# Patient Record
Sex: Female | Born: 1945 | Race: Black or African American | Hispanic: No | Marital: Married | Smoking: Never smoker
Health system: Southern US, Community
[De-identification: ages and names within clinical notes are randomized; demographics above are authoritative.]

## PROBLEM LIST (undated history)

## (undated) ENCOUNTER — Emergency Department: Disposition: A | Discharge status: Home / Self Care | Payer: Medicare HMO

## (undated) DIAGNOSIS — I10 Essential (primary) hypertension: Secondary | ICD-10-CM

## (undated) HISTORY — PX: NO PAST SURGERIES: SHX2092

## (undated) HISTORY — DX: Essential (primary) hypertension: I10

---

## 1997-10-04 ENCOUNTER — Ambulatory Visit: Admission: RE | Admit: 1997-10-04 | Discharge: 1997-10-04 | Payer: Self-pay | Admitting: Obstetrics and Gynecology

## 1997-11-30 ENCOUNTER — Observation Stay (HOSPITAL_COMMUNITY): Admission: EM | Admit: 1997-11-30 | Discharge: 1997-12-01 | Payer: Self-pay

## 1998-09-04 ENCOUNTER — Encounter: Payer: Self-pay | Admitting: Obstetrics and Gynecology

## 1998-09-04 ENCOUNTER — Ambulatory Visit (HOSPITAL_COMMUNITY): Admission: RE | Admit: 1998-09-04 | Discharge: 1998-09-04 | Payer: Self-pay | Admitting: Obstetrics and Gynecology

## 1998-09-12 ENCOUNTER — Ambulatory Visit (HOSPITAL_COMMUNITY): Admission: RE | Admit: 1998-09-12 | Discharge: 1998-09-12 | Payer: Self-pay | Admitting: Obstetrics and Gynecology

## 1998-09-12 ENCOUNTER — Encounter: Payer: Self-pay | Admitting: Obstetrics and Gynecology

## 1999-03-22 ENCOUNTER — Other Ambulatory Visit: Admission: RE | Admit: 1999-03-22 | Discharge: 1999-03-22 | Payer: Self-pay | Admitting: Obstetrics and Gynecology

## 1999-09-17 ENCOUNTER — Encounter: Payer: Self-pay | Admitting: Obstetrics and Gynecology

## 1999-09-17 ENCOUNTER — Ambulatory Visit (HOSPITAL_COMMUNITY): Admission: RE | Admit: 1999-09-17 | Discharge: 1999-09-17 | Payer: Self-pay | Admitting: Obstetrics and Gynecology

## 2000-04-16 ENCOUNTER — Other Ambulatory Visit: Admission: RE | Admit: 2000-04-16 | Discharge: 2000-04-16 | Payer: Self-pay | Admitting: Obstetrics and Gynecology

## 2000-09-17 ENCOUNTER — Ambulatory Visit (HOSPITAL_COMMUNITY): Admission: RE | Admit: 2000-09-17 | Discharge: 2000-09-17 | Payer: Self-pay | Admitting: *Deleted

## 2001-04-21 ENCOUNTER — Other Ambulatory Visit: Admission: RE | Admit: 2001-04-21 | Discharge: 2001-04-21 | Payer: Self-pay | Admitting: *Deleted

## 2001-09-23 ENCOUNTER — Ambulatory Visit (HOSPITAL_COMMUNITY): Admission: RE | Admit: 2001-09-23 | Discharge: 2001-09-23 | Payer: Self-pay | Admitting: *Deleted

## 2002-04-28 ENCOUNTER — Other Ambulatory Visit: Admission: RE | Admit: 2002-04-28 | Discharge: 2002-04-28 | Payer: Self-pay | Admitting: Obstetrics & Gynecology

## 2002-10-07 ENCOUNTER — Encounter (INDEPENDENT_AMBULATORY_CARE_PROVIDER_SITE_OTHER): Payer: Self-pay | Admitting: *Deleted

## 2002-10-07 ENCOUNTER — Ambulatory Visit (HOSPITAL_COMMUNITY): Admission: RE | Admit: 2002-10-07 | Discharge: 2002-10-07 | Payer: Self-pay | Admitting: Gastroenterology

## 2002-10-13 ENCOUNTER — Encounter: Payer: Self-pay | Admitting: Obstetrics & Gynecology

## 2002-10-13 ENCOUNTER — Encounter: Admission: RE | Admit: 2002-10-13 | Discharge: 2002-10-13 | Payer: Self-pay | Admitting: Obstetrics & Gynecology

## 2003-05-12 ENCOUNTER — Other Ambulatory Visit: Admission: RE | Admit: 2003-05-12 | Discharge: 2003-05-12 | Payer: Self-pay | Admitting: Obstetrics & Gynecology

## 2003-11-01 ENCOUNTER — Ambulatory Visit (HOSPITAL_COMMUNITY): Admission: RE | Admit: 2003-11-01 | Discharge: 2003-11-01 | Payer: Self-pay | Admitting: Family Medicine

## 2004-11-01 ENCOUNTER — Ambulatory Visit (HOSPITAL_COMMUNITY): Admission: RE | Admit: 2004-11-01 | Discharge: 2004-11-01 | Payer: Self-pay | Admitting: Family Medicine

## 2005-11-05 ENCOUNTER — Ambulatory Visit (HOSPITAL_COMMUNITY): Admission: RE | Admit: 2005-11-05 | Discharge: 2005-11-05 | Payer: Self-pay | Admitting: Family Medicine

## 2006-11-18 ENCOUNTER — Ambulatory Visit (HOSPITAL_COMMUNITY): Admission: RE | Admit: 2006-11-18 | Discharge: 2006-11-18 | Payer: Self-pay | Admitting: Family Medicine

## 2006-11-23 ENCOUNTER — Encounter: Admission: RE | Admit: 2006-11-23 | Discharge: 2006-11-23 | Payer: Self-pay | Admitting: Family Medicine

## 2007-11-18 ENCOUNTER — Ambulatory Visit (HOSPITAL_COMMUNITY): Admission: RE | Admit: 2007-11-18 | Discharge: 2007-11-18 | Payer: Self-pay | Admitting: Family Medicine

## 2007-12-27 ENCOUNTER — Ambulatory Visit (HOSPITAL_COMMUNITY): Admission: RE | Admit: 2007-12-27 | Discharge: 2007-12-27 | Payer: Self-pay | Admitting: Gastroenterology

## 2007-12-27 ENCOUNTER — Encounter (INDEPENDENT_AMBULATORY_CARE_PROVIDER_SITE_OTHER): Payer: Self-pay | Admitting: Gastroenterology

## 2008-09-21 ENCOUNTER — Emergency Department (HOSPITAL_COMMUNITY): Admission: EM | Admit: 2008-09-21 | Discharge: 2008-09-21 | Payer: Self-pay | Admitting: Emergency Medicine

## 2008-11-22 ENCOUNTER — Ambulatory Visit (HOSPITAL_COMMUNITY): Admission: RE | Admit: 2008-11-22 | Discharge: 2008-11-22 | Payer: Self-pay | Admitting: Family Medicine

## 2009-12-05 ENCOUNTER — Ambulatory Visit (HOSPITAL_COMMUNITY): Admission: RE | Admit: 2009-12-05 | Discharge: 2009-12-05 | Payer: Self-pay | Admitting: Family Medicine

## 2009-12-11 ENCOUNTER — Encounter: Admission: RE | Admit: 2009-12-11 | Discharge: 2009-12-11 | Payer: Self-pay | Admitting: Family Medicine

## 2010-04-28 ENCOUNTER — Encounter: Payer: Self-pay | Admitting: Family Medicine

## 2010-08-20 NOTE — Op Note (Signed)
NAME:  Kathleen Berg, Kathleen Berg           ACCOUNT NO.:  [id number]   MEDICAL RECORD NO.:  [id number]          PATIENT TYPE:  AMB   LOCATION:  ENDO                         FACILITY:  [facility]   PHYSICIAN:  Anselmo Rod, M.D.  DATE OF BIRTH:  1946/03/14   DATE OF PROCEDURE:  12/27/2007  DATE OF DISCHARGE:  12/27/2007                               OPERATIVE REPORT   PROCEDURE PERFORMED:  Colonoscopy with multiple cold biopsies.   ENDOSCOPIST:  Anselmo Rod, MD.   INSTRUMENT USED:  Pentax video colonoscope.   INDICATION FOR PROCEDURE:  A 65 year old black female with a history of  adenomatous polyps removed in the past undergoing a surveillance  colonoscopy to rule out recurrent polyps.   PREPROCEDURE PREPARATION:  Informed consent was procured from the  patient. The patient was fasted for 4 hours prior to the procedure and  prepped with a bottle of magnesium citrate and gallon of NuLYTELY the  night prior to the procedure.  Risks and benefits of the procedure  including a 10% miss rate of cancer and polyp were discussed with the  patient as well.   PREPROCEDURE PHYSICAL:  The patient had stable vital signs.  NECK:  Supple.  CHEST:  Clear to auscultation.  S1, S2 regular.  ABDOMEN:  Soft with normal bowel sounds.   DESCRIPTION OF PROCEDURE:  The patient was placed in left lateral  decubitus position and sedated with 50 mcg of Fentanyl and 6 mg of  Versed given intravenously in slow incremental doses.  Once the patient  was adequately sedate and maintained on low-flow oxygen and continuous  cardiac monitoring, the Pentax video colonoscope was advanced from the  rectum to the cecum. Five small sessile polyps were removed by cold  biopsy from the right colon.  The appendiceal orifice and the ileocecal  valve were clearly visualized and photographed.  The terminal ileum  appeared healthy and without lesions.  Retroflexion in the rectum  revealed no abnormalities.  The patient tolerated  the procedure well  without immediate complications.   IMPRESSION:  Five small sessile polyps removed by cold biopsies from the  right colon, otherwise normal colonoscopy up to the terminal ileum.  No  masses, diverticula or arteriovenous malformations noted.   RECOMMENDATIONS:  1. Await pathology results.  2. Avoid all nonsteroidals including aspirin for the next 2 weeks.  3. Repeat colonoscopy in the next 5 years.  4. Outpatient followup as need arises in the future.      Anselmo Rod, M.D.  Electronically Signed     JNM/MEDQ  D:  12/28/2007  T:  12/29/2007  Job:  [id number]   cc:   Charlesetta Shanks

## 2010-08-23 NOTE — Op Note (Signed)
   NAME:  Kathleen Berg, Kathleen Berg                     ACCOUNT NO.:  [id number]   MEDICAL RECORD NO.:  [id number]                   PATIENT TYPE:  AMB   LOCATION:  ENDO                                 FACILITY:  [facility]   PHYSICIAN:  Anselmo Rod, M.D.               DATE OF BIRTH:  Aug 10, 1945   DATE OF PROCEDURE:  10/07/2002  DATE OF DISCHARGE:                                 OPERATIVE REPORT   PROCEDURE:  Colonoscopy with snare polypectomy x 2 and cold biopsies x 4.   ENDOSCOPIST:  Charna Lashunta, M.D.   INSTRUMENT USED:  Olympus video colonoscope (adjustable pediatric scope).   INDICATIONS FOR PROCEDURE:  65 year old African American female underwent  screening colonoscopy to rule out colonic polyps, masses, etc.   PREPROCEDURE PREPARATION:  Informed consent was obtained from the patient.  The patient was fasted for eight hours prior to the procedure and prepped  with a bottle of magnesium citrate and a gallon of GoLYTELY the night prior  to the procedure.   PREPROCEDURE PHYSICAL:  Patient with stable vital signs.  Neck supple.  Chest clear to auscultation.  S1 and S2 regular.  Abdomen soft with normal  bowel sounds.   DESCRIPTION OF PROCEDURE:  The patient was placed in the left lateral  decubitus position, sedated with 60 mg of Demerol and 6 mg Versed  intravenously.  Once the patient was adequately sedated, maintained on low  flow oxygen, continuous cardiac monitoring, the Olympus video colonoscope  was advanced into the rectum to the cecum without difficulty.  The patient  had an excellent prep.  Two small sessile polyps were cold biopsied from the  transverse colon.  Two small sessile polyps were snared and biopsied from  the right colon.  There was no evidence of diverticulosis, masses,  hemorrhoids, erosions, or ulcerations.  The appendiceal orifice and  ileocecal valve were clearly visualized and photographed.   IMPRESSION:  1. Two small sessile polyps biopsied from the  transverse colon.  2. Four polyps removed from right colon (see description above).  3. No evidence of diverticulosis.  4. Retroflexion in the rectum revealed no abnormalities.    RECOMMENDATIONS:  1. Await pathology results.  2. Avoid nonsteroidals including aspirin for the next two weeks.  3. Outpatient follow up in the next two weeks for further recommendations.                                               Anselmo Rod, M.D.    JNM/MEDQ  D:  10/07/2002  T:  10/07/2002  Job:  [id number]   cc:   Lacretia Leigh. Quintella Reichert, M.D.  [address] [address] [place]  [place] [address]  Fax: [phone]

## 2010-11-19 ENCOUNTER — Other Ambulatory Visit: Payer: Self-pay | Admitting: Physician Assistant

## 2010-11-19 DIAGNOSIS — Z139 Encounter for screening, unspecified: Secondary | ICD-10-CM

## 2010-12-16 ENCOUNTER — Ambulatory Visit
Admission: RE | Admit: 2010-12-16 | Discharge: 2010-12-16 | Disposition: A | Discharge status: Home / Self Care | Payer: Medicare Other | Source: Ambulatory Visit | Attending: Physician Assistant | Admitting: Physician Assistant

## 2010-12-16 DIAGNOSIS — Z139 Encounter for screening, unspecified: Secondary | ICD-10-CM

## 2011-09-19 ENCOUNTER — Other Ambulatory Visit: Payer: Self-pay | Admitting: Physician Assistant

## 2011-11-28 ENCOUNTER — Other Ambulatory Visit: Payer: Self-pay | Admitting: Family Medicine

## 2011-11-28 DIAGNOSIS — Z1231 Encounter for screening mammogram for malignant neoplasm of breast: Secondary | ICD-10-CM

## 2011-12-17 ENCOUNTER — Ambulatory Visit
Admission: RE | Admit: 2011-12-17 | Discharge: 2011-12-17 | Disposition: A | Discharge status: Home / Self Care | Payer: Medicare Other | Source: Ambulatory Visit | Attending: Family Medicine | Admitting: Family Medicine

## 2011-12-17 DIAGNOSIS — Z1231 Encounter for screening mammogram for malignant neoplasm of breast: Secondary | ICD-10-CM

## 2012-05-11 ENCOUNTER — Other Ambulatory Visit: Payer: Self-pay | Admitting: Family Medicine

## 2012-05-11 ENCOUNTER — Other Ambulatory Visit: Payer: Self-pay | Admitting: Internal Medicine

## 2012-05-11 DIAGNOSIS — N649 Disorder of breast, unspecified: Secondary | ICD-10-CM

## 2012-05-12 ENCOUNTER — Ambulatory Visit
Admission: RE | Admit: 2012-05-12 | Discharge: 2012-05-12 | Disposition: A | Discharge status: Home / Self Care | Payer: Medicare Other | Source: Ambulatory Visit | Attending: Family Medicine | Admitting: Family Medicine

## 2012-05-12 DIAGNOSIS — N649 Disorder of breast, unspecified: Secondary | ICD-10-CM

## 2013-01-10 ENCOUNTER — Other Ambulatory Visit: Payer: Self-pay

## 2013-01-10 DIAGNOSIS — Z1231 Encounter for screening mammogram for malignant neoplasm of breast: Secondary | ICD-10-CM

## 2013-01-11 ENCOUNTER — Ambulatory Visit
Admission: RE | Admit: 2013-01-11 | Discharge: 2013-01-11 | Disposition: A | Discharge status: Home / Self Care | Payer: Medicare Other | Source: Ambulatory Visit

## 2013-01-11 DIAGNOSIS — Z1231 Encounter for screening mammogram for malignant neoplasm of breast: Secondary | ICD-10-CM

## 2014-01-03 ENCOUNTER — Other Ambulatory Visit: Payer: Self-pay

## 2014-01-03 DIAGNOSIS — Z1231 Encounter for screening mammogram for malignant neoplasm of breast: Secondary | ICD-10-CM

## 2014-01-12 ENCOUNTER — Ambulatory Visit
Admission: RE | Admit: 2014-01-12 | Discharge: 2014-01-12 | Disposition: A | Discharge status: Home / Self Care | Payer: Medicare HMO | Source: Ambulatory Visit

## 2014-01-12 DIAGNOSIS — Z1231 Encounter for screening mammogram for malignant neoplasm of breast: Secondary | ICD-10-CM

## 2015-01-24 ENCOUNTER — Other Ambulatory Visit: Payer: Self-pay

## 2015-01-24 DIAGNOSIS — Z1231 Encounter for screening mammogram for malignant neoplasm of breast: Secondary | ICD-10-CM

## 2015-02-12 ENCOUNTER — Other Ambulatory Visit: Payer: Self-pay

## 2015-02-12 ENCOUNTER — Ambulatory Visit
Admission: RE | Admit: 2015-02-12 | Discharge: 2015-02-12 | Disposition: A | Discharge status: Home / Self Care | Payer: Medicare HMO | Source: Ambulatory Visit

## 2015-02-12 DIAGNOSIS — Z1231 Encounter for screening mammogram for malignant neoplasm of breast: Secondary | ICD-10-CM

## 2016-02-08 ENCOUNTER — Other Ambulatory Visit: Payer: Self-pay | Admitting: Family Medicine

## 2016-02-08 DIAGNOSIS — Z1231 Encounter for screening mammogram for malignant neoplasm of breast: Secondary | ICD-10-CM

## 2016-03-14 ENCOUNTER — Ambulatory Visit
Admission: RE | Admit: 2016-03-14 | Discharge: 2016-03-14 | Disposition: A | Discharge status: Home / Self Care | Payer: Medicare HMO | Source: Ambulatory Visit | Attending: Family Medicine | Admitting: Family Medicine

## 2016-03-14 DIAGNOSIS — Z1231 Encounter for screening mammogram for malignant neoplasm of breast: Secondary | ICD-10-CM

## 2017-02-03 ENCOUNTER — Other Ambulatory Visit: Payer: Self-pay | Admitting: Family Medicine

## 2017-02-03 DIAGNOSIS — Z1231 Encounter for screening mammogram for malignant neoplasm of breast: Secondary | ICD-10-CM

## 2017-03-16 ENCOUNTER — Ambulatory Visit: Payer: Medicare HMO

## 2017-03-25 ENCOUNTER — Ambulatory Visit
Admission: RE | Admit: 2017-03-25 | Discharge: 2017-03-25 | Disposition: A | Discharge status: Home / Self Care | Payer: Medicare HMO | Source: Ambulatory Visit | Attending: Family Medicine | Admitting: Family Medicine

## 2017-03-25 DIAGNOSIS — Z1231 Encounter for screening mammogram for malignant neoplasm of breast: Secondary | ICD-10-CM

## 2018-03-02 ENCOUNTER — Other Ambulatory Visit: Payer: Self-pay | Admitting: Family Medicine

## 2018-03-02 DIAGNOSIS — Z1231 Encounter for screening mammogram for malignant neoplasm of breast: Secondary | ICD-10-CM

## 2018-04-01 ENCOUNTER — Ambulatory Visit
Admission: RE | Admit: 2018-04-01 | Discharge: 2018-04-01 | Disposition: A | Discharge status: Home / Self Care | Payer: Medicare HMO | Source: Ambulatory Visit | Attending: Family Medicine | Admitting: Family Medicine

## 2018-04-01 DIAGNOSIS — Z1231 Encounter for screening mammogram for malignant neoplasm of breast: Secondary | ICD-10-CM

## 2019-02-24 ENCOUNTER — Other Ambulatory Visit: Payer: Self-pay | Admitting: Family Medicine

## 2019-02-24 DIAGNOSIS — Z1231 Encounter for screening mammogram for malignant neoplasm of breast: Secondary | ICD-10-CM

## 2019-04-20 ENCOUNTER — Ambulatory Visit: Payer: Medicare HMO

## 2019-04-26 ENCOUNTER — Ambulatory Visit: Payer: Medicare Other | Attending: Internal Medicine

## 2019-04-26 DIAGNOSIS — Z23 Encounter for immunization: Secondary | ICD-10-CM

## 2019-04-26 NOTE — Progress Notes (Signed)
   Covid-19 Vaccination Clinic  Name:  ZNIYA COTTONE    MRN: [id number] DOB: 12-18-1945  04/26/2019  Ms. Patin was observed post Covid-19 immunization for 15 minutes without incidence. She was provided with Vaccine Information Sheet and instruction to access the V-Safe system.   Ms. Bachmann was instructed to call 911 with any severe reactions post vaccine: Marland Kitchen Difficulty breathing  . Swelling of your face and throat  . A fast heartbeat  . A bad rash all over your body  . Dizziness and weakness    Immunizations Administered    Name Date Dose VIS Date Route   Pfizer COVID-19 Vaccine 04/26/2019  1:43 AM 0.3 mL 03/18/2019 Intramuscular   Manufacturer: [employer], [employer]   Lot: [id number]   NDC: [address]-2

## 2019-05-16 ENCOUNTER — Ambulatory Visit: Payer: Medicare HMO | Attending: Internal Medicine

## 2019-05-16 DIAGNOSIS — Z23 Encounter for immunization: Secondary | ICD-10-CM | POA: Insufficient documentation

## 2019-05-16 NOTE — Progress Notes (Signed)
   Covid-19 Vaccination Clinic  Name:  Kathleen Berg    MRN: [id number] DOB: November 23, 1945  05/16/2019  [name] was observed post Covid-19 immunization for 15 minutes without incidence. She was provided with Vaccine Information Sheet and instruction to access the V-Safe system.   [name] was instructed to call 911 with any severe reactions post vaccine: Marland Kitchen Difficulty breathing  . Swelling of your face and throat  . A fast heartbeat  . A bad rash all over your body  . Dizziness and weakness    Immunizations Administered    Name Date Dose VIS Date Route   Pfizer COVID-19 Vaccine 05/16/2019  1:50 PM 0.3 mL 03/18/2019 Intramuscular   Manufacturer: [employer], [employer]   Lot: [id number]   NDC: [address]-4

## 2019-07-12 ENCOUNTER — Other Ambulatory Visit: Payer: Self-pay

## 2019-07-12 ENCOUNTER — Ambulatory Visit
Admission: RE | Admit: 2019-07-12 | Discharge: 2019-07-12 | Disposition: A | Discharge status: Home / Self Care | Payer: Medicare HMO | Source: Ambulatory Visit | Attending: Family Medicine | Admitting: Family Medicine

## 2019-07-12 DIAGNOSIS — Z1231 Encounter for screening mammogram for malignant neoplasm of breast: Secondary | ICD-10-CM

## 2020-04-25 ENCOUNTER — Other Ambulatory Visit: Payer: Self-pay | Admitting: Family Medicine

## 2020-04-25 DIAGNOSIS — Z1231 Encounter for screening mammogram for malignant neoplasm of breast: Secondary | ICD-10-CM

## 2020-07-12 ENCOUNTER — Inpatient Hospital Stay: Admission: RE | Admit: 2020-07-12 | Payer: Medicare HMO | Source: Ambulatory Visit

## 2020-08-31 ENCOUNTER — Ambulatory Visit
Admission: RE | Admit: 2020-08-31 | Discharge: 2020-08-31 | Disposition: A | Discharge status: Home / Self Care | Payer: Medicare HMO | Source: Ambulatory Visit | Attending: Family Medicine | Admitting: Family Medicine

## 2020-08-31 ENCOUNTER — Other Ambulatory Visit: Payer: Self-pay

## 2020-08-31 DIAGNOSIS — Z1231 Encounter for screening mammogram for malignant neoplasm of breast: Secondary | ICD-10-CM

## 2021-08-07 ENCOUNTER — Other Ambulatory Visit: Payer: Self-pay | Admitting: Family Medicine

## 2021-08-07 DIAGNOSIS — Z1231 Encounter for screening mammogram for malignant neoplasm of breast: Secondary | ICD-10-CM

## 2021-09-03 ENCOUNTER — Ambulatory Visit
Admission: RE | Admit: 2021-09-03 | Discharge: 2021-09-03 | Disposition: A | Discharge status: Home / Self Care | Payer: Medicare HMO | Source: Ambulatory Visit | Attending: Family Medicine | Admitting: Family Medicine

## 2021-09-03 DIAGNOSIS — Z1231 Encounter for screening mammogram for malignant neoplasm of breast: Secondary | ICD-10-CM

## 2022-01-28 ENCOUNTER — Encounter: Payer: Self-pay | Admitting: Neurology

## 2022-01-28 ENCOUNTER — Ambulatory Visit: Payer: Medicare HMO | Admitting: Neurology

## 2022-01-28 VITALS — BP 143/89 | HR 82 | Ht 63.0 in | Wt 124.8 lb

## 2022-01-28 DIAGNOSIS — R202 Paresthesia of skin: Secondary | ICD-10-CM | POA: Diagnosis not present

## 2022-01-28 MED ORDER — GABAPENTIN 100 MG PO CAPS: 100.0000 mg | ORAL_CAPSULE | Freq: Three times a day (TID) | ORAL | 2 refills | Status: DC

## 2022-01-28 NOTE — Progress Notes (Signed)
[facility]    Provider:  Dr Jaynee Eagles Requesting Provider: Bartholome Bill, MD Primary Care Provider:  Bartholome Bill, MD  CC:  burning foot pain  HPI:  Kathleen Berg is a 76 y.o. female here as requested by Bartholome Bill, MD for burning foot pain. PMHx hypertension, osteopenia of the lumbar spine, weight loss, sleep disturbance, osteopenia, no past surgeries.  I reviewed Dr. Merilyn Baba notes: Her examination was normal including general, skin, HEENT, neck, breast, chest and lung, cardiovascular, GI, peripheral vascular, neurologic, neuropsychiatric musculoskeletal and lymphatic.  Patient says she has been having intermittent leg swelling, lower leg ankle, may be related to amlodipine, they reduced the dose to 2.5, otherwise I see no specific information to the burning foot pain other than lower extremity edema and Dr. Merilyn Baba notes.  There is a message exchange that I see saying that she wanted a referral to have the top of her right foot looked at and discussed.  I did not see that that was examined.  But Dr. Luciana Axe did order B12, CRP, ferritin, hemoglobin A1c, sed rate, TSH, B1, B6 but I do not see those results.  She was just sent to neurology.  Patient is here and says 5 years agi she has burning on the top of her right foot. Ongoing a long time ago. It as gotten better. This year it came back just the top. She crosses her legs a lot. Especially the right leg over the left leg. She sits like that all the time. No pain at the knee. No radiation from the knee. No tight shoes. The other leg is not affected. Sometimes for a week it burns, but then it gets better then it comes back. Sometimes it lasts and gets better. She does not work. No weakness. No back pain or shooting pain into the right leg.    Review of Systems: Patient complains of symptoms per HPI as well as the following symptoms top or right foot burning. Pertinent negatives and positives per HPI. All  others negative.   Social History   Socioeconomic History   Marital status: Married    Spouse name: Not on file   Number of children: Not on file   Years of education: Not on file   Highest education level: Not on file  Occupational History   Not on file  Tobacco Use   Smoking status: Never    Passive exposure: Never   Smokeless tobacco: Never  Vaping Use   Vaping Use: Never used  Substance and Sexual Activity   Alcohol use: Never   Drug use: Never   Sexual activity: Not on file  Other Topics Concern   Not on file  Social History Narrative   Not on file   Social Determinants of Health   Financial Resource Strain: Not on file  Food Insecurity: Not on file  Transportation Needs: Not on file  Physical Activity: Not on file  Stress: Not on file  Social Connections: Not on file  Intimate Partner Violence: Not on file    Family History  Problem Relation Age of Onset   Neuropathy Neg Hx     Past Medical History:  Diagnosis Date   Hypertension     Patient Active Problem List   Diagnosis Date Noted   Paresthesia of right foot 01/28/2022    Past Surgical History:  Procedure Laterality Date   NO PAST SURGERIES      Current Outpatient Medications  Medication Sig Dispense Refill  amLODipine (NORVASC) 2.5 MG tablet Take 1 tablet by mouth daily.     amLODipine (NORVASC) 5 MG tablet Take 5 mg by mouth daily.     Cholecalciferol (D 1000) 25 MCG (1000 UT) capsule Take by mouth.     gabapentin (NEURONTIN) 100 MG capsule Take 1 capsule (100 mg total) by mouth 3 (three) times daily. 90 capsule 2   hydrochlorothiazide (HYDRODIURIL) 12.5 MG tablet every evening.     Multiple Vitamin (MULTIVITAMIN) capsule Take 1 capsule by mouth daily.     No current facility-administered medications for this visit.    Allergies as of 01/28/2022   (No Known Allergies)    Vitals: BP (!) 143/89   Pulse 82   Ht 5\' 3"  (1.6 m)   Wt 124 lb 12.8 oz (56.6 kg)   BMI 22.11 kg/m  Last  Weight:  Wt Readings from Last 1 Encounters:  01/28/22 124 lb 12.8 oz (56.6 kg)   Last Height:   Ht Readings from Last 1 Encounters:  01/28/22 5\' 3"  (1.6 m)     Physical exam: Exam: Gen: NAD, conversant, well nourised, obese, well groomed                     CV: RRR, no MRG. No Carotid Bruits. No peripheral edema, warm, nontender Eyes: Conjunctivae clear without exudates or hemorrhage  Neuro: Detailed Neurologic Exam  Speech:    Speech is normal; fluent and spontaneous with normal comprehension.  Cognition:    The patient is oriented to person, place, and time;     recent and remote memory intact;     language fluent;     normal attention, concentration,     fund of knowledge Cranial Nerves:    The pupils are equal, round, and reactive to light. The fundi are normal and spontaneous venous pulsations are present. Visual fields are full to finger confrontation. Extraocular movements are intact. Trigeminal sensation is intact and the muscles of mastication are normal. The face is symmetric. The palate elevates in the midline. Hearing intact. Voice is normal. Shoulder shrug is normal. The tongue has normal motion without fasciculations.   Coordination:    Normal  Gait:    Heel-toe and tandem gait are normal.   Motor Observation:    No asymmetry, no atrophy, and no involuntary movements noted. Tone:    Normal muscle tone.    Posture:    Posture is normal. normal erect    Strength:    Strength is V/V in the upper and lower limbs.      Sensation: intact to LT     Reflex Exam:  DTR's:    Deep tendon reflexes in the upper and lower extremities are normal bilaterally.   Toes:    The toes are downgoing bilaterally.   Clonus:    Clonus is absent.    Assessment/PlanVery healthy 76 year old with waxing and waning only on the top of the right foot burning pain. Differential could be peroneal neuropathy (crosses her legs) less likely L5 radic no back pain or radiculopathy.  Sensory and strength exam completely normal.  Conservative meansures: TRY NOT TO CROSS LEGS for afew months and see if it gets better. If it doesn't we will order an em/ncs where we measure the nerve there.   Gabapentin prn.  Also buy some lidocaine cream over the counter, numbing cream Dr. Luciana Axe did order B12, CRP, ferritin, hemoglobin A1c, sed rate, TSH, B1, B6 but I do not see  those results.Follow up with her about it.  Meds ordered this encounter  Medications   gabapentin (NEURONTIN) 100 MG capsule    Sig: Take 1 capsule (100 mg total) by mouth 3 (three) times daily.    Dispense:  90 capsule    Refill:  2    Cc: Bartholome Bill, MD,  Bartholome Bill, MD  Sarina Ill, MD  [facility] [address] [place], [place] [address]  Phone [phone] Fax [phone]  I spent over 30 minutes of face-to-face and non-face-to-face time with patient on the  1. Paresthesia of right foot    diagnosis.  This included previsit chart review, lab review, study review, order entry, electronic health record documentation, patient education on the different diagnostic and therapeutic options, counseling and coordination of care, risks and benefits of management, compliance, or risk factor reduction

## 2022-01-28 NOTE — Patient Instructions (Addendum)
TRY NOT TO CROSS LEGS for afew months and see if it gets better. If it doesn't we will order an em/ncs where we measure the nerve there. Also buy some lidocaine cream over the counter, numbing cream     Common peroneal nerve dysfunction Common fibular nerve dysfunction; Neuropathy - common peroneal nerve; Peroneal nerve injury; Peroneal nerve palsy; Fibular neuropathy  Common peroneal nerve dysfunction is due to damage to the peroneal nerve leading to loss of movement or sensation in the foot and leg. This condition is also called common fibular nerve dysfunction.   Causes The peroneal nerve is a branch of the sciatic nerve. It supplies movement and sensation to the lower leg, foot and toes. Common peroneal nerve dysfunction is a type of peripheral neuropathy (nerve damage outside the brain or spinal cord). This condition can affect people of any age.  Common peroneal nerve is a type of mononeuropathy. Mononeuropathy is nerve damage to a single nerve. Certain body-wide conditions can also cause single nerve injuries.  Damage to the nerve disrupts the myelin sheath that covers the axon (branch of the nerve cell). The axon can also be injured, which is a more severe injury that causes similar symptoms.  Common causes of damage to the peroneal nerve include the following:  Trauma or injury to the knee Fracture of the fibula (a bone of the lower leg) Use of a tight plaster cast (or other long-term constriction) of the lower leg Crossing the legs regularly Regularly wearing high boots Pressure to the knee from positions during deep sleep or coma Injury during knee surgery or from being placed in an awkward position during anesthesia Common peroneal nerve injury is often seen in people:  Who are very thin (for example, from anorexia nervosa) Who have certain autoimmune conditions, such as polyarteritis nodosa Who have nerve damage from other medical problems, such as diabetes or alcohol  use Who have Charcot-Marie-Tooth disease, an inherited disorder that affects all of the nerves Symptoms When the nerve is injured and results in dysfunction, symptoms may include:  Decreased sensation, numbness, or tingling in the top of the foot or the outer part of the upper or lower leg Foot that drops (unable to hold the foot up) "Slapping" gait (walking pattern in which each step makes a slapping noise) Toes drag while walking Walking problems Weakness of the ankles or feet Loss of muscle mass because the nerves aren't stimulating the muscles Exams and Tests The health care provider will perform a physical exam, which may show:  Loss of muscle control in the lower legs and feet Atrophy (thinning or loss) of the foot or foreleg muscles Difficulty lifting up the foot and toes and making toe-out movements Tests of nerve activity include:  Electromyography (EMG), a test of electrical activity in muscles) Nerve conduction tests (to see how fast electrical signals move through a nerve) Magnetic resonance imaging (MRI) Nerve ultrasound Other tests may be done depending on the suspected cause of nerve dysfunction, and the person's symptoms and how they develop. Tests may include blood tests, x-rays and scans.  Treatment Treatment aims to improve mobility and independence. Any illness or other cause of the neuropathy should be treated. Padding the knee may prevent further injury by crossing the legs, while also serving as a reminder to not cross your legs.  In some cases, corticosteroids injected into the area may reduce swelling and pressure on the nerve.  Surgery may help reduce symptoms in some cases. It may be  needed:  To relieve pressure on the nerve if the disorder is caused by pressure on the nerve To remove tumors pressing on the nerve You may need surgery if:  The disorder does not go away You have problems with movement There is evidence that the nerve axon is  damaged CONTROLLING SYMPTOMS  You may need over-the-counter or prescription pain relievers to control pain. Other medicines that may be used to reduce pain include:  Gabapentin Carbamazepine Tricyclic antidepressants, such as amitriptyline If your pain is severe, a pain specialist can help you explore all options for pain relief.  Physical therapy exercises may help you maintain muscle strength.  Orthopedic devices may improve your ability to walk and prevent contractures. These may include:  Braces Splints Orthopedic shoes or other equipment Vocational counseling, occupational therapy, or similar programs may help you stay as mobile and independent as possible.  Outlook (Prognosis) Outcome depends on the cause of the problem. Successfully treating the cause may relieve the dysfunction, but it may take several months for the nerve to improve.  Severe nerve damage may cause permanent disability. The nerve pain may be very uncomfortable. This disorder does not usually shorten a person's expected lifespan.  Possible Complications Problems that may develop with this condition include:  Decreased ability to walk Permanent decrease in sensation in the legs or feet Permanent weakness or paralysis in the legs or feet Side effects of medicines When to Contact a Medical Professional Call your provider if you have symptoms of common peroneal nerve dysfunction.  Prevention Avoid crossing your legs or putting long-term pressure on the back or side of the knee. Treat injuries to the leg or knee right away.  If a cast, splint, dressing, or other pressure on the lower leg causes a tight feeling or numbness, call your provider.   OR a pinched nerve in low back BUT no back pain so less likely L5 pinched nerve     Electromyoneurogram Electromyoneurogram is a test to check how well your muscles and nerves are working. This procedure includes the combined use of electromyogram (EMG) and nerve  conduction study (NCS). EMG is used to evaluate muscles and the nerves that control those muscles. NCS, which is also called electroneurogram, measures how well your nerves conduct electricity. The procedures should be done together to check if your muscles and nerves are healthy. If the results of the tests are abnormal, this may indicate disease or injury, such as a neuromuscular disease or peripheral nerve damage. Tell a health care provider about: Any allergies you have. All medicines you are taking, including vitamins, herbs, eye drops, creams, and over-the-counter medicines. Any bleeding problems you have. Any surgeries you have had. Any medical conditions you have. What are the risks? Generally, this is a safe procedure. However, problems may occur, including: Bleeding or bruising. Infection where the electrodes were inserted. What happens before the test? Medicines Take all of your usually prescribed medications before this testing is performed. Do not stop your blood thinners unless advised by your prescribing physician. General instructions Your health care provider may ask you to warm the limb that will be checked with warm water, hot pack, or wrapping the limb in a blanket. Do not use lotions or creams on the same day that you will be having the procedure. What happens during the test? For EMG  Your health care provider will ask you to stay in a position so that the muscle being studied can be accessed. You will be sitting  or lying down. You may be given a medicine to numb the area (local anesthetic) and the skin will be disinfected. A very thin needle that has an electrode will be inserted into your muscle, one muscle at a time. Typically, multiple muscles are evaluated during a single study. Another small electrode will be placed on your skin near the muscle. Your health care provider will ask you to continue to remain still. The electrodes will record the electrical activity  of your muscles. You may see this on a monitor or hear it in the room. After your muscles have been studied at rest, your health care provider will ask you to contract or flex your muscles. The electrodes will record the electrical activity of your muscles. Your health care provider will remove the electrodes and the electrode needle when the procedure is finished. The procedure may vary among health care providers and hospitals. For NCS  An electrode that records your nerve activity (recording electrode) will be placed on your skin by the muscle that is being studied. An electrode that is used as a reference (reference electrode) will be placed near the recording electrode. A paste or gel will be applied to your skin between the recording electrode and the reference electrode. Your nerve will be stimulated with a mild shock. The speed of the nerves and strength of response is recorded by the electrodes. Your health care provider will remove the electrodes and the gel when the procedure is finished. The procedure may vary among health care providers and hospitals. What can I expect after the test? It is up to you to get your test results. Ask your health care provider, or the department that is doing the test, when your results will be ready. Your health care provider may: Give you medicines for any pain. Monitor the insertion sites to make sure that bleeding stops. You should be able to drive yourself to and from the test. Discomfort can persist for a few hours after the test, but should be better the next day. Contact a health care provider if: You have swelling, redness, or drainage at any of the insertion sites. Summary Electromyoneurogram is a test to check how well your muscles and nerves are working. If the results of the tests are abnormal, this may indicate disease or injury. This is a safe procedure. However, problems may occur, such as bleeding and infection. Your health care  provider will do two tests to complete this procedure. One checks your muscles (EMG) and another checks your nerves (NCS). It is up to you to get your test results. Ask your health care provider, or the department that is doing the test, when your results will be ready. This information is not intended to replace advice given to you by your health care provider. Make sure you discuss any questions you have with your health care provider. Document Revised: 12/05/2020 Document Reviewed: 11/04/2020 Elsevier Patient Education  2023 Elsevier Inc.  Gabapentin Capsules or Tablets What is this medication? GABAPENTIN (GA ba pen tin) treats nerve pain. It may also be used to prevent and control seizures in people with epilepsy. It works by calming overactive nerves in your body. This medicine may be used for other purposes; ask your health care provider or pharmacist if you have questions. COMMON BRAND NAME(S): Active-PAC with Gabapentin, Ascencion DikeGabarone, Gralise, Neurontin What should I tell my care team before I take this medication? They need to know if you have any of these conditions: Alcohol or substance  use disorder Kidney disease Lung or breathing disease Suicidal thoughts, plans, or attempt; a previous suicide attempt by you or a family member An unusual or allergic reaction to gabapentin, other medications, foods, dyes, or preservatives Pregnant or trying to get pregnant Breast-feeding How should I use this medication? Take this medication by mouth with a glass of water. Follow the directions on the prescription label. You can take it with or without food. If it upsets your stomach, take it with food. Take your medication at regular intervals. Do not take it more often than directed. Do not stop taking except on your care team's advice. If you are directed to break the 600 or 800 mg tablets in half as part of your dose, the extra half tablet should be used for the next dose. If you have not used the  extra half tablet within 28 days, it should be thrown away. A special MedGuide will be given to you by the pharmacist with each prescription and refill. Be sure to read this information carefully each time. Talk to your care team about the use of this medication in children. While this medication may be prescribed for children as young as 3 years for selected conditions, precautions do apply. Overdosage: If you think you have taken too much of this medicine contact a poison control center or emergency room at once. NOTE: This medicine is only for you. Do not share this medicine with others. What if I miss a dose? If you miss a dose, take it as soon as you can. If it is almost time for your next dose, take only that dose. Do not take double or extra doses. What may interact with this medication? Alcohol Antihistamines for allergy, cough, and cold Certain medications for anxiety or sleep Certain medications for depression like amitriptyline, fluoxetine, sertraline Certain medications for seizures like phenobarbital, primidone Certain medications for stomach problems General anesthetics like halothane, isoflurane, methoxyflurane, propofol Local anesthetics like lidocaine, pramoxine, tetracaine Medications that relax muscles for surgery Opioid medications for pain Phenothiazines like chlorpromazine, mesoridazine, prochlorperazine, thioridazine This list may not describe all possible interactions. Give your health care provider a list of all the medicines, herbs, non-prescription drugs, or dietary supplements you use. Also tell them if you smoke, drink alcohol, or use illegal drugs. Some items may interact with your medicine. What should I watch for while using this medication? Visit your care team for regular checks on your progress. You may want to keep a record at home of how you feel your condition is responding to treatment. You may want to share this information with your care team at each  visit. You should contact your care team if your seizures get worse or if you have any new types of seizures. Do not stop taking this medication or any of your seizure medications unless instructed by your care team. Stopping your medication suddenly can increase your seizures or their severity. This medication may cause serious skin reactions. They can happen weeks to months after starting the medication. Contact your care team right away if you notice fevers or flu-like symptoms with a rash. The rash may be red or purple and then turn into blisters or peeling of the skin. Or, you might notice a red rash with swelling of the face, lips or lymph nodes in your neck or under your arms. Wear a medical identification bracelet or chain if you are taking this medication for seizures. Carry a card that lists all your medications. This medication  may affect your coordination, reaction time, or judgment. Do not drive or operate machinery until you know how this medication affects you. Sit up or stand slowly to reduce the risk of dizzy or fainting spells. Drinking alcohol with this medication can increase the risk of these side effects. Your mouth may get dry. Chewing sugarless gum or sucking hard candy, and drinking plenty of water may help. Watch for new or worsening thoughts of suicide or depression. This includes sudden changes in mood, behaviors, or thoughts. These changes can happen at any time but are more common in the beginning of treatment or after a change in dose. Call your care team right away if you experience these thoughts or worsening depression. If you become pregnant while using this medication, you may enroll in the [place] American Antiepileptic Drug Pregnancy Registry by calling [phone]. This registry collects information about the safety of antiepileptic medication use during pregnancy. What side effects may I notice from receiving this medication? Side effects that you should report to  your care team as soon as possible: Allergic reactions or angioedema--skin rash, itching, hives, swelling of the face, eyes, lips, tongue, arms, or legs, trouble swallowing or breathing Rash, fever, and swollen lymph nodes Thoughts of suicide or self harm, worsening mood, feelings of depression Trouble breathing Unusual changes in mood or behavior in children after use such as difficulty concentrating, hostility, or restlessness Side effects that usually do not require medical attention (report to your care team if they continue or are bothersome): Dizziness Drowsiness Nausea Swelling of ankles, feet, or hands Vomiting This list may not describe all possible side effects. Call your doctor for medical advice about side effects. You may report side effects to FDA at 1-800-FDA-1088. Where should I keep my medication? Keep out of reach of children and pets. Store at room temperature between 15 and 30 degrees C (59 and 86 degrees F). Get rid of any unused medication after the expiration date. This medication may cause accidental overdose and death if taken by other adults, children, or pets. To get rid of medications that are no longer needed or have expired: Take the medication to a medication take-back program. Check with your pharmacy or law enforcement to find a location. If you cannot return the medication, check the label or package insert to see if the medication should be thrown out in the garbage or flushed down the toilet. If you are not sure, ask your care team. If it is safe to put it in the trash, empty the medication out of the container. Mix the medication with cat litter, dirt, coffee grounds, or other unwanted substance. Seal the mixture in a bag or container. Put it in the trash. NOTE: This sheet is a summary. It may not cover all possible information. If you have questions about this medicine, talk to your doctor, pharmacist, or health care provider.  2023 Elsevier/Gold Standard  (2020-03-27 00:00:00)

## 2022-05-15 ENCOUNTER — Telehealth: Payer: Medicare HMO | Admitting: Family Medicine

## 2022-07-07 NOTE — Patient Instructions (Signed)
Below is our plan:  We will continue gabapentin 100mg  three times daily   Please make sure you are staying well hydrated. I recommend 50-60 ounces daily. Well balanced diet and regular exercise encouraged. Consistent sleep schedule with 6-8 hours recommended.   Please continue follow up with care team as directed.   Follow up with me in 1 year   You may receive a survey regarding today's visit. I encourage you to leave honest feed back as I do use this information to improve patient care. Thank you for seeing me today!

## 2022-07-07 NOTE — Progress Notes (Unsigned)
PATIENT: Kathleen Berg DOB: Dec 29, 1945  REASON FOR VISIT: follow up HISTORY FROM: patient  Virtual Visit via Telephone Note  I connected with Alric Seton on 07/08/22 at  9:00 AM EDT by telephone and verified that I am speaking with the correct person using two identifiers.   I discussed the limitations, risks, security and privacy concerns of performing an evaluation and management service by telephone and the availability of in person appointments. I also discussed with the patient that there may be a patient responsible charge related to this service. The patient expressed understanding and agreed to proceed.   History of Present Illness:  07/08/22 ALL: MARYPATRICIA Berg is a 77 y.o. female here today for follow up burning foot pain. She was seen in consult with Dr Jaynee Eagles 01/2022 and started on gabapentin 100mg  TID as needed. She did not start med until 04/2022 due to traveling. She does note benefit with taking gabapentin. Burning sensation is less intense. She notices burning more when eating spicy foods. She has tolerated gabapentin well.   History (copied from Dr Guadelupe Sabin previous note)  HPI:  Kathleen Berg is a 77 y.o. female here as requested by Bartholome Bill, MD for burning foot pain. PMHx hypertension, osteopenia of the lumbar spine, weight loss, sleep disturbance, osteopenia, no past surgeries.   I reviewed Dr. Merilyn Baba notes: Her examination was normal including general, skin, HEENT, neck, breast, chest and lung, cardiovascular, GI, peripheral vascular, neurologic, neuropsychiatric musculoskeletal and lymphatic.  Patient says she has been having intermittent leg swelling, lower leg ankle, may be related to amlodipine, they reduced the dose to 2.5, otherwise I see no specific information to the burning foot pain other than lower extremity edema and Dr. Merilyn Baba notes.  There is a message exchange that I see saying that she wanted a referral to have the top of  her right foot looked at and discussed.  I did not see that that was examined.  But Dr. Luciana Axe did order B12, CRP, ferritin, hemoglobin A1c, sed rate, TSH, B1, B6 but I do not see those results.  She was just sent to neurology.   Patient is here and says 5 years agi she has burning on the top of her right foot. Ongoing a long time ago. It as gotten better. This year it came back just the top. She crosses her legs a lot. Especially the right leg over the left leg. She sits like that all the time. No pain at the knee. No radiation from the knee. No tight shoes. The other leg is not affected. Sometimes for a week it burns, but then it gets better then it comes back. Sometimes it lasts and gets better. She does not work. No weakness. No back pain or shooting pain into the right leg.    Observations/Objective:  Generalized: Well developed, in no acute distress  Mentation: Alert oriented to time, place, history taking. Follows all commands speech and language fluent   Assessment and Plan:  77 y.o. year old female  has a past medical history of Hypertension. here with    ICD-10-CM   1. Paresthesia of right foot  R20.2      Assata is doing well on gabapentin. We will continue 100mg  TID. She was encouraged to continue healthy lifestyle habits. We will follow up in 1 year, sooner if needed.   No orders of the defined types were placed in this encounter.   Meds ordered this encounter  Medications  gabapentin (NEURONTIN) 100 MG capsule    Sig: Take 1 capsule (100 mg total) by mouth 3 (three) times daily.    Dispense:  270 capsule    Refill:  3    Order Specific Question:   Supervising Provider    Answer:   Melvenia Beam [id number]     Follow Up Instructions:  I discussed the assessment and treatment plan with the patient. The patient was provided an opportunity to ask questions and all were answered. The patient agreed with the plan and demonstrated an understanding of the  instructions.   The patient was advised to call back or seek an in-person evaluation if the symptoms worsen or if the condition fails to improve as anticipated.  I provided 15 minutes of non-face-to-face time during this encounter. Patient located at their place of residence during [place] visit. Provider is in the office.    Debbora Presto, NP

## 2022-07-08 ENCOUNTER — Telehealth (INDEPENDENT_AMBULATORY_CARE_PROVIDER_SITE_OTHER): Payer: Medicare HMO | Admitting: Family Medicine

## 2022-07-08 ENCOUNTER — Encounter: Payer: Self-pay | Admitting: Family Medicine

## 2022-07-08 DIAGNOSIS — R202 Paresthesia of skin: Secondary | ICD-10-CM | POA: Diagnosis not present

## 2022-07-08 MED ORDER — GABAPENTIN 100 MG PO CAPS: 100.0000 mg | ORAL_CAPSULE | Freq: Three times a day (TID) | ORAL | 3 refills | Status: AC

## 2022-11-11 IMAGING — MG MM DIGITAL SCREENING BILAT W/ TOMO AND CAD
8 series · 9 of 24 positions shown · non-contrast
Comparison: Previous exam(s).

CLINICAL DATA: Screening.

EXAM:
DIGITAL SCREENING BILATERAL MAMMOGRAM WITH TOMOSYNTHESIS AND CAD
TECHNIQUE: Bilateral screening digital craniocaudal and mediolateral oblique
mammograms were obtained. Bilateral screening digital breast
tomosynthesis was performed. The images were evaluated with
computer-aided detection.

[L MLO synth-2D]
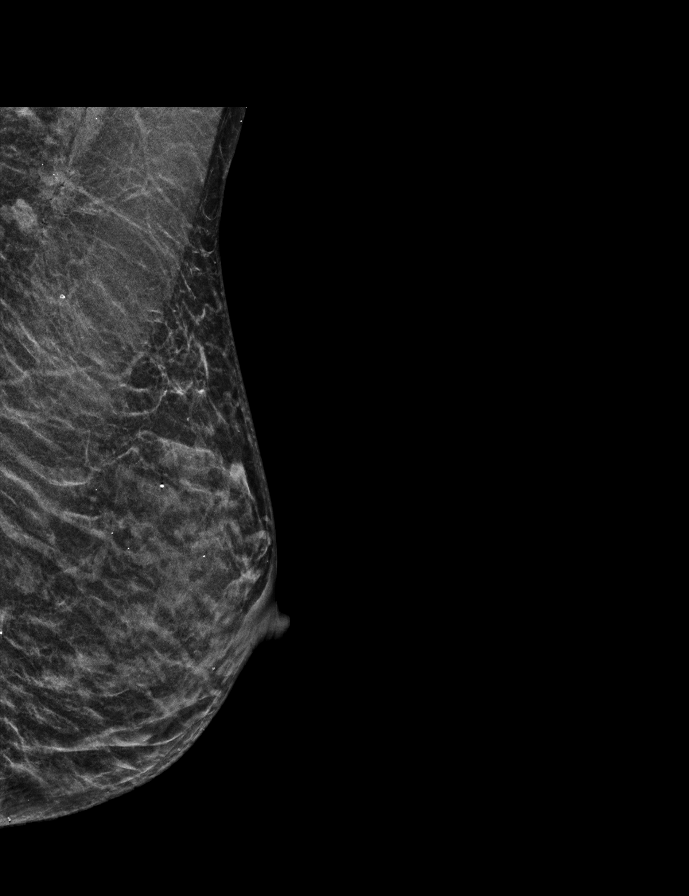

[L CC synth-2D]
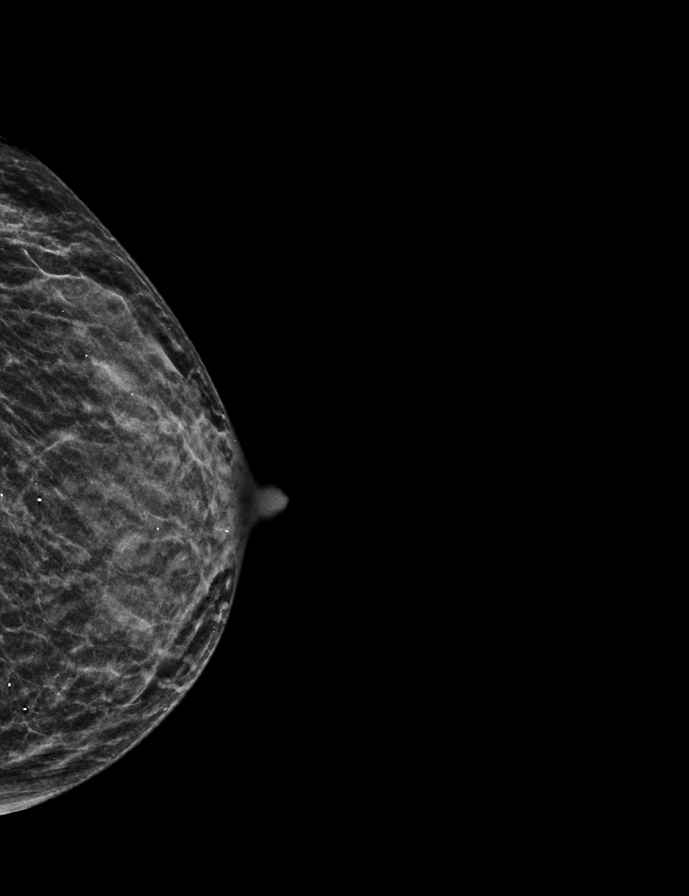

[R MLO synth-2D]
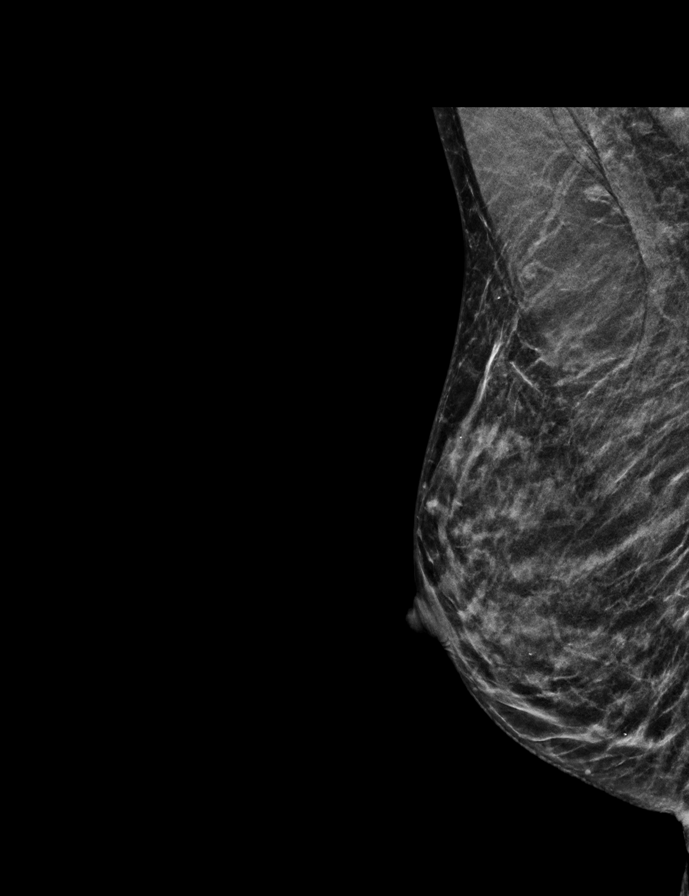

[R CC synth-2D]
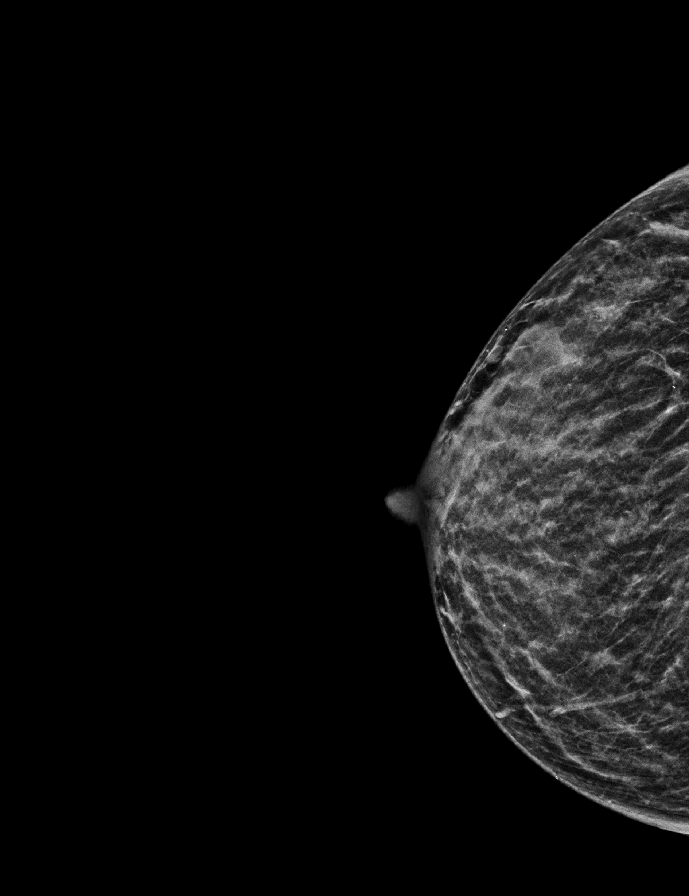

[L CC tomo · 2 of 32 frames shown]
[frame 11/32]
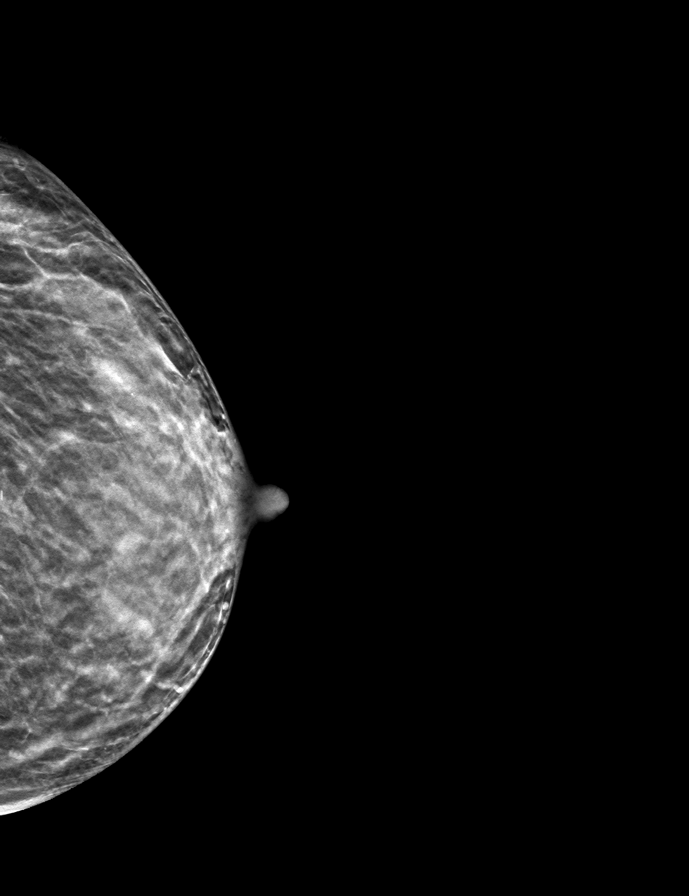
[frame 17/32]
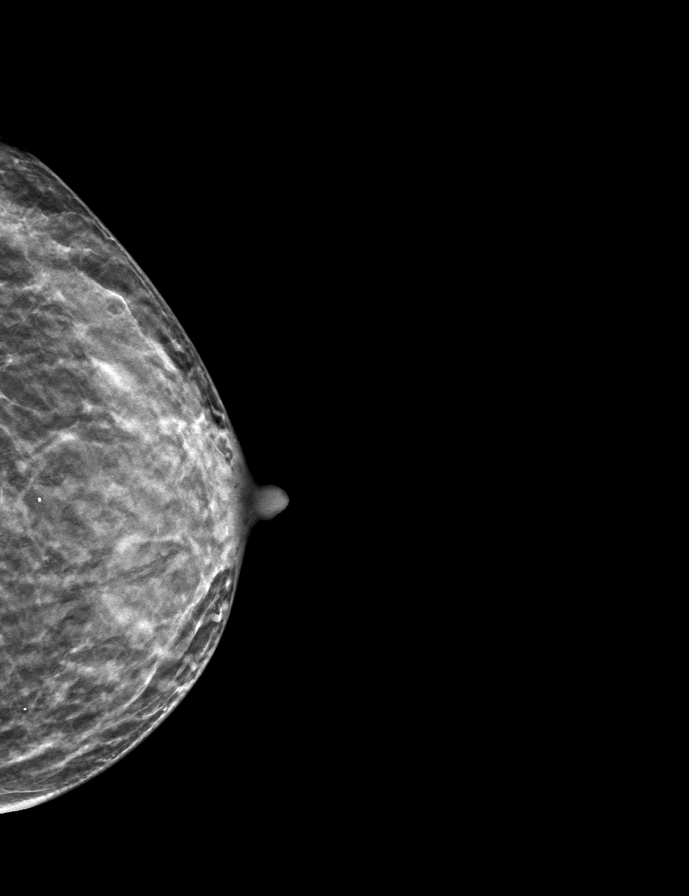

[L MLO tomo · tomo slice 19/37.0]
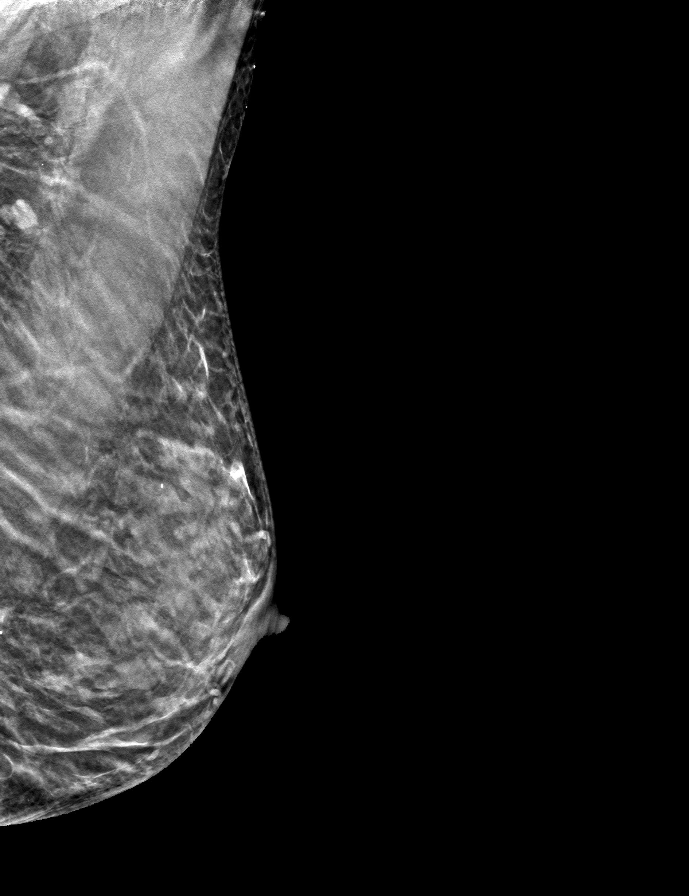

[R MLO tomo · tomo slice 19/38.0]
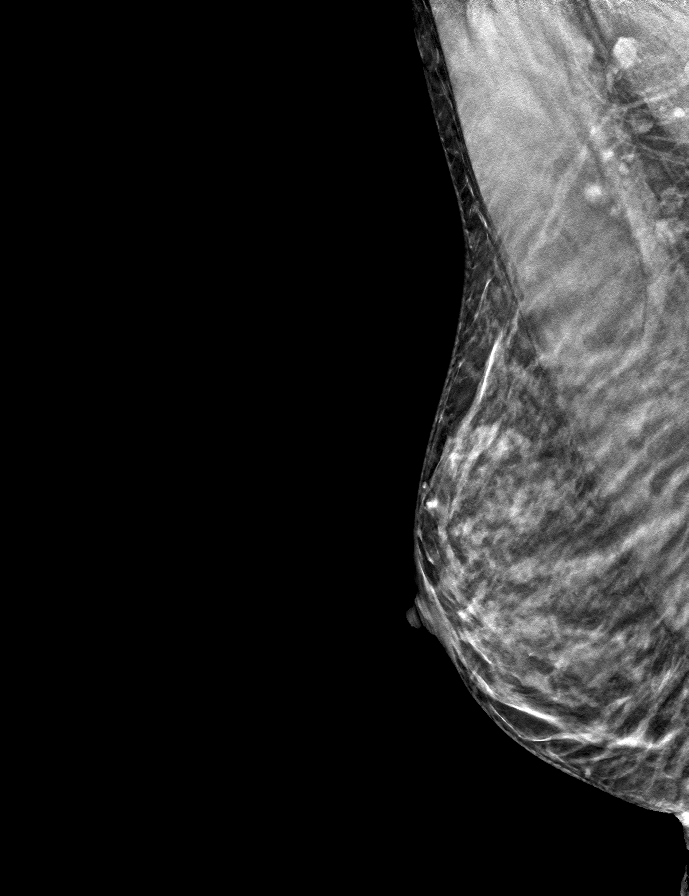

[R CC tomo · tomo slice 15/30.0]
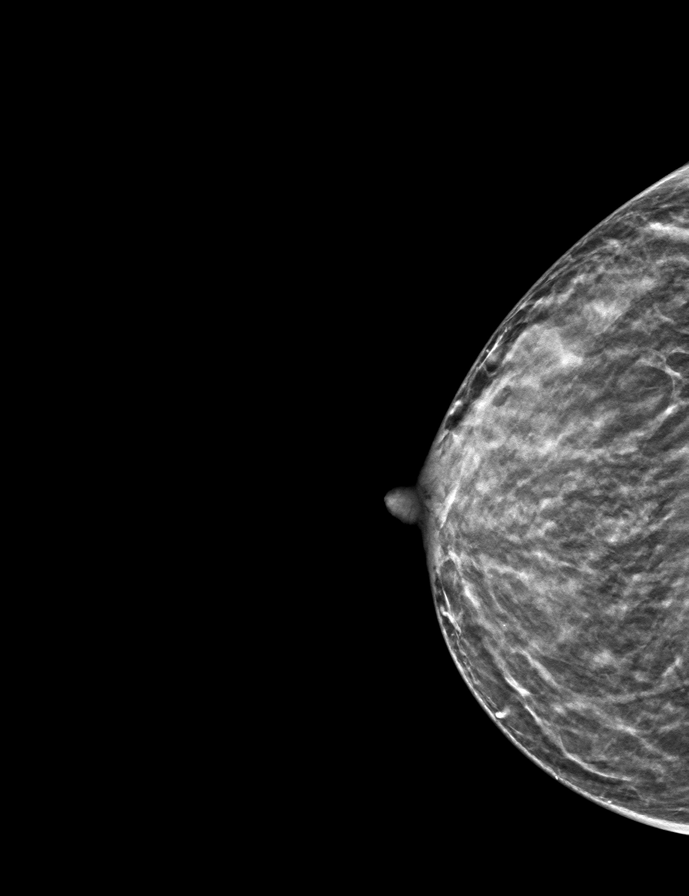

[9 of 24 positions shown; findings below may reference images not displayed]

ACR Breast Density Category c: The breast tissue is heterogeneously
dense, which may obscure small masses.
FINDINGS: There are no findings suspicious for malignancy.
IMPRESSION: No mammographic evidence of malignancy. A result letter of this
screening mammogram will be mailed directly to the patient.

RECOMMENDATION:
Screening mammogram in one year. (Code:[id number])

BI-RADS CATEGORY  1: Negative.

## 2022-12-11 ENCOUNTER — Other Ambulatory Visit: Payer: Self-pay
